# Patient Record
Sex: Female | Born: 1990 | Race: White | Hispanic: No | Marital: Married | Smoking: Never smoker
Health system: Southern US, Community
[De-identification: ages and names within clinical notes are randomized; demographics above are authoritative.]

## PROBLEM LIST (undated history)

## (undated) DIAGNOSIS — H4710 Unspecified papilledema: Secondary | ICD-10-CM

## (undated) HISTORY — DX: Unspecified papilledema: H47.10

---
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
## 2012-12-14 ENCOUNTER — Ambulatory Visit: Payer: Self-pay | Admitting: Certified Nurse Midwife

## 2013-01-04 ENCOUNTER — Telehealth: Payer: Self-pay | Admitting: Certified Nurse Midwife

## 2013-01-04 MED ORDER — NORETHIN ACE-ETH ESTRAD-FE 1-20 MG-MCG PO TABS: 1.0000 | ORAL_TABLET | Freq: Every day | ORAL | Status: DC

## 2013-01-04 NOTE — Telephone Encounter (Signed)
Would like 7month supply of the junel sent to [facility] in [place] at [phone].

## 2013-01-04 NOTE — Telephone Encounter (Signed)
Junel FE 1/20 x 1 month/0 refills [facility] to [facility] in [place], [place] ([address]) per pt's request. aex on 01/17/2013. last aex was on 12/14/2011.

## 2013-01-17 ENCOUNTER — Ambulatory Visit: Payer: Self-pay | Admitting: Certified Nurse Midwife

## 2013-03-15 ENCOUNTER — Telehealth: Payer: Self-pay | Admitting: Certified Nurse Midwife

## 2013-03-15 MED ORDER — NORETHIN ACE-ETH ESTRAD-FE 1-20 MG-MCG PO TABS: 1.0000 | ORAL_TABLET | Freq: Every day | ORAL | Status: DC

## 2013-03-15 NOTE — Telephone Encounter (Signed)
Patient has made appointment for Sept 15,2014 @ 10:45 . She needs her OCP 's called in until this appointment because she's out of her RX. Can you please call another RX. Until this date. Patient  was told that she had to make this appointment .

## 2013-03-15 NOTE — Telephone Encounter (Signed)
Called patient and left vm that we would refill one month ONLY and that it is highly important for her to make appointment 04/10/13 that way she can get refills for a year.  Junel 1/20 #30/0rf's sent to [facility] to last pt until AEX.

## 2013-04-10 ENCOUNTER — Ambulatory Visit: Payer: Self-pay | Admitting: Certified Nurse Midwife

## 2014-07-23 ENCOUNTER — Other Ambulatory Visit: Payer: Self-pay | Admitting: Ophthalmology

## 2014-07-23 DIAGNOSIS — H4710 Unspecified papilledema: Secondary | ICD-10-CM

## 2014-07-30 ENCOUNTER — Other Ambulatory Visit: Payer: Self-pay

## 2014-08-15 ENCOUNTER — Ambulatory Visit (INDEPENDENT_AMBULATORY_CARE_PROVIDER_SITE_OTHER): Payer: Managed Care, Other (non HMO) | Admitting: Neurology

## 2014-08-15 ENCOUNTER — Encounter: Payer: Self-pay | Admitting: Neurology

## 2014-08-15 VITALS — BP 114/74 | HR 80 | Ht 63.0 in | Wt 162.0 lb

## 2014-08-15 DIAGNOSIS — H4710 Unspecified papilledema: Secondary | ICD-10-CM

## 2014-08-15 HISTORY — DX: Unspecified papilledema: H47.10

## 2014-08-15 NOTE — Progress Notes (Signed)
Reason for visit: Papilledema  Kelsey Ferguson is a 24 y.o. female  History of present illness:  Kelsey Ferguson is a 24 year old right-handed white female with a history of a viral illness in October 2015. The patient has severe headache associated with this. The patient has developed some blurring of vision around this time, and by mid December, she went to her optometrist, Dr. Hyacinth Meeker. She was found to have papilledema that was a new finding from the year prior. She was sent to Dr. Luciana Axe, and papilledema was confirmed. The patient was taken off of her birth control pills. She has not had any further headache events, but she continues to have blurred vision. She denies any neck stiffness or muffled hearing. She denies any visual loss or double vision. She has no weakness or numbness of the face, arms, or legs. She denies any issues with balance, or difficulty with falls. She has not had any problems controlling the bowels or the bladder. She denies any memory issues or confusion. She is sent to this office for an evaluation.  Past Medical History  Diagnosis Date  . Optic nerve edema   . 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 No pertinent past surgical history.  Family History  Problem Relation Age of Onset  . Pancreatic cancer Maternal Grandmother   . Heart disease Maternal Grandfather   . Lung cancer Paternal Grandfather   . Healthy Mother   . Healthy Father   . Healthy Sister     Social history:  reports that she has never smoked. She has never used smokeless tobacco. She reports that she drinks alcohol. She reports that she does not use illicit drugs.  Medications:  No current outpatient prescriptions on file prior to visit.   No current facility-administered medications on file prior to visit.     No Known Allergies  ROS:  Out of a complete 14 system review of symptoms, the patient complains only of the following symptoms, and all other reviewed systems are  negative.  Blurred vision Headache  Blood pressure 114/74, pulse 80, height  (1.6 m), weight 162 lb (73.483 kg).  Physical Exam  General: The patient is alert and cooperative at the time of the examination.  Eyes: Pupils are equal, round, and reactive to light. Disc margins are blurred bilaterally. No hemorrhages are seen.  Neck: The neck is supple, no carotid bruits are noted.  Respiratory: The respiratory examination is clear.  Cardiovascular: The cardiovascular examination reveals a regular rate and rhythm, no obvious murmurs or rubs are noted.  Skin: Extremities are without significant edema.  Neurologic Exam  Mental status: The patient is alert and oriented x 3 at the time of the examination. The patient has apparent normal recent and remote memory, with an apparently normal attention span and concentration ability.  Cranial nerves: Facial symmetry is present. There is good sensation of the face to pinprick and soft touch bilaterally. The strength of the facial muscles and the muscles to head turning and shoulder shrug are normal bilaterally. Speech is well enunciated, no aphasia or dysarthria is noted. Extraocular movements are full. Visual fields are full. The tongue is midline, and the patient has symmetric elevation of the soft palate. No obvious hearing deficits are noted.  Motor: The motor testing reveals 5 over 5 strength of all 4 extremities. Good symmetric motor tone is noted throughout.  Sensory: Sensory testing is intact to pinprick, soft touch, vibration sensation, and position sense on all  4 extremities. No evidence of extinction is noted.  Coordination: Cerebellar testing reveals good finger-nose-finger and heel-to-shin bilaterally.  Gait and station: Gait is normal. Tandem gait is normal. Romberg is negative. No drift is seen.  Reflexes: Deep tendon reflexes are symmetric and normal bilaterally. Toes are downgoing bilaterally.   Assessment/Plan:  1.  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 The patient did have onset of a viral illness around the time of the symptoms. The patient be sent for lumbar puncture, and the opening pressure will be documented. If the spinal fluid is normal, and the pressure is elevated, treatment will be initiated. If the opening pressure is normal, we may check visual evoked response test to exclude the possibility of optic nerve involvement. The patient will follow-up in 3 months.  Marlan Palau. Keith Korben Carcione MD 08/15/2014 6:47 PM  [facility] [address] [place], [place] [id number]  Phone [phone] Fax [phone]

## 2014-08-15 NOTE — Patient Instructions (Signed)
Pseudotumor Cerebri Pseudotumor cerebri, also called idiopathic intracranial hypertension, is a condition that occurs due to increased pressure within your skull. Although some of the symptoms resemble those of a brain tumor, it is not a brain tumor. Symptoms occur when the increased pressure in your skull compresses brain structures. For example, pressure on the nerve responsible for vision (optic nerve) causes it to swell, resulting in visual symptoms. Pseudotumor cerebri tends to occur in obese women younger than 24 years of age. However, men and children can also develop this condition. SYMPTOMS  Symptoms of pseudotumor cerebri occur due to increased pressure within the skull. Symptoms may include:   Headaches.  Nausea and vomiting.  Dizziness.  High blood pressure.   Ringing in the ears.  Double or blurred vision.  Brief episodes of complete loss of vision.  Pain in the back, neck, or shoulders. DIAGNOSIS  Pseudotumor cerebri is diagnosed through:  A detailed eye exam, which can reveal a swollen optic nerve, as well as identifying issues such as blind spots in the vision.  An MRI or CT scan to rule out other disorders that can cause similar symptoms, such as brain tumors.  A spinal tap (lumbar puncture), which can demonstrate increased pressure within the skull. TREATMENT  There are several ways that pseudotumor cerebri is treated, including:  Medicines to decrease the production of spinal fluid and lower the pressure within your skull.  Medicines to prevent or treat headaches.  Surgery to create an opening in your optic nerve to allow excess fluid to drain out.  Surgery to place drains (shunts) in your brain to remove excess fluid. HOME CARE INSTRUCTIONS   Take all medicines as directed by your health care provider.  Go to all of your follow-up appointments.  Lose weight if you are overweight. SEEK MEDICAL CARE IF:  Any symptoms come back.  You develop  trouble with hearing, vision, balance, or your sense of smell.  You cannot eat or drink what you need.  You are more weak or tired than usual.   You are losing weight without trying. SEEK IMMEDIATE MEDICAL CARE IF:  You have new symptoms such as vision problems or difficulty walking.   You have a seizure.   You have trouble breathing.   You have a fever.  Document Released: 07/18/2013 Document Reviewed: 07/18/2013 ExitCare Patient Information 2015 [employer]. This information is not intended to replace advice given to you by your health care provider. Make sure you discuss any questions you have with your health care provider.  

## 2014-08-23 ENCOUNTER — Ambulatory Visit
Admission: RE | Admit: 2014-08-23 | Discharge: 2014-08-23 | Disposition: A | Discharge status: Home / Self Care | Payer: Managed Care, Other (non HMO) | Source: Ambulatory Visit | Attending: Neurology | Admitting: Neurology

## 2014-08-23 ENCOUNTER — Other Ambulatory Visit: Payer: Self-pay | Admitting: Neurology

## 2014-08-23 DIAGNOSIS — H4710 Unspecified papilledema: Secondary | ICD-10-CM

## 2014-08-23 LAB — GLUCOSE, CSF: Glucose, CSF: 48 mg/dL (ref 43–76)

## 2014-08-23 LAB — CSF CELL COUNT WITH DIFFERENTIAL
RBC COUNT CSF: 0 uL
Tube #: 3
WBC CSF: 1 uL (ref 0–5)

## 2014-08-23 LAB — PROTEIN, CSF: TOTAL PROTEIN, CSF: 30 mg/dL (ref 15–45)

## 2014-08-23 NOTE — Progress Notes (Signed)
Blood drawn to go with spinal fluid. 1 SST tube filled from right AC, site is unremarkable and pt tolerated procedure well. Discharge instructions explained to pt.

## 2014-08-23 NOTE — Discharge Instructions (Signed)

## 2014-08-25 LAB — VDRL, CSF: VDRL Quant, CSF: NONREACTIVE

## 2014-08-26 ENCOUNTER — Telehealth: Payer: Self-pay | Admitting: Neurology

## 2014-08-26 MED ORDER — ACETAZOLAMIDE 125 MG PO TABS: 125.0000 mg | ORAL_TABLET | Freq: Two times a day (BID) | ORAL | Status: DC

## 2014-08-26 NOTE — Telephone Encounter (Signed)
I called the patient. The CSF looks OK, but the OP was 30. I will call in diamox 125 bid. The patient is having spinal headaches. She is to call me if they persist.

## 2014-08-27 ENCOUNTER — Encounter: Payer: Self-pay | Admitting: Neurology

## 2014-08-27 ENCOUNTER — Telehealth: Payer: Self-pay | Admitting: *Deleted

## 2014-08-27 DIAGNOSIS — G971 Other reaction to spinal and lumbar puncture: Secondary | ICD-10-CM

## 2014-08-27 NOTE — Telephone Encounter (Signed)
I called the patient. She is still having spinal headaches, I'll get her set up for a blood patch. She did not go to work today because of the headache.

## 2014-08-27 NOTE — Telephone Encounter (Signed)
Patient is still having a headache from the LP she had. Patient been resting but still no relief. Please advise

## 2014-08-28 ENCOUNTER — Telehealth: Payer: Self-pay | Admitting: Radiology

## 2014-08-28 ENCOUNTER — Other Ambulatory Visit: Payer: Self-pay | Admitting: Neurology

## 2014-08-28 DIAGNOSIS — H4710 Unspecified papilledema: Secondary | ICD-10-CM

## 2014-08-28 NOTE — Telephone Encounter (Signed)
Had LP last week and was still having headaches. Headache is better today and will wait till tomorrow am to call if blood patch is needed.

## 2014-08-28 NOTE — Telephone Encounter (Signed)
Blood patch order has been entered in other orders.

## 2014-08-30 LAB — OLIGOCLONAL BANDS, CSF + SERM

## 2014-11-08 ENCOUNTER — Ambulatory Visit: Payer: Managed Care, Other (non HMO) | Admitting: Adult Health

## 2014-11-08 ENCOUNTER — Ambulatory Visit (INDEPENDENT_AMBULATORY_CARE_PROVIDER_SITE_OTHER): Payer: Managed Care, Other (non HMO) | Admitting: Nurse Practitioner

## 2014-11-08 ENCOUNTER — Encounter: Payer: Self-pay | Admitting: Nurse Practitioner

## 2014-11-08 VITALS — BP 100/63 | HR 65 | Ht 63.0 in | Wt 147.0 lb

## 2014-11-08 DIAGNOSIS — Z5181 Encounter for therapeutic drug level monitoring: Secondary | ICD-10-CM

## 2014-11-08 DIAGNOSIS — H4710 Unspecified papilledema: Secondary | ICD-10-CM

## 2014-11-08 MED ORDER — ACETAZOLAMIDE 125 MG PO TABS: 125.0000 mg | ORAL_TABLET | Freq: Two times a day (BID) | ORAL | Status: DC

## 2014-11-08 NOTE — Progress Notes (Signed)
I have read the note, and I agree with the clinical assessment and plan.  Verlinda Slotnick KEITH   

## 2014-11-08 NOTE — Patient Instructions (Signed)
Continue Diamox at current dose Will check BMP today Follow-up in 6 months

## 2014-11-08 NOTE — Progress Notes (Signed)
[facility]  PATIENT: Kelsey Ferguson DOB: 05/23/1991   REASON FOR VISIT: Follow-up for papilledema/pseudotumor cerebri syndrome HISTORY FROM: Patient and mother    HISTORY OF PRESENT ILLNESS:Kelsey Ferguson is a 24 year old right-handed white female with a history of a viral illness in October 2015. The patient has severe headache associated with this. The patient has developed some blurring of vision around this time, and by mid December, she went to her optometrist, Dr. Hyacinth MeekerMiller. She was found to have papilledema that was a new finding from the year prior. She was sent to Dr. Luciana Axeankin, and papilledema was confirmed. The patient was taken off of her birth control pills. She has not had any further headache events, but she continues to have blurred vision. She denies any neck stiffness or muffled hearing. She denies any visual loss or double vision. She has no weakness or numbness of the face, arms, or legs. She denies any issues with balance, or difficulty with falls. She has not had any problems controlling the bowels or the bladder. She denies any memory issues or confusion. She is sent to this office for an evaluation. UP DATE 11/08/14 Kelsey Ferguson returns for follow-up of her papilledema. She had LP after last visit with Dr. Anne HahnWillis with opening pressure of 30. She was started on Diamox 125 mg twice daily and she denies side effects to the medication. She denies any blurred vision or double vision. She is not having headaches. She returns for reevaluation  REVIEW OF SYSTEMS: Full 14 system review of systems performed and notable only for those listed, all others are neg:  Constitutional: neg  Cardiovascular: neg Ear/Nose/Throat: neg  Skin: neg Eyes: neg Respiratory: neg Gastroitestinal: neg  Hematology/Lymphatic: neg  Endocrine: neg Musculoskeletal:neg Allergy/Immunology: Allergies  Neurological: neg Psychiatric: neg Sleep : neg   ALLERGIES: No Known Allergies  HOME  MEDICATIONS: Outpatient Prescriptions Prior to Visit  Medication Sig Dispense Refill  . acetaZOLAMIDE (DIAMOX) 125 MG tablet Take 1 tablet (125 mg total) by mouth 2 (two) times daily. 60 tablet 3  . Dapsone 5 % topical gel Apply topically daily.    Marland Kitchen. tretinoin (RETIN-A) 0.025 % cream   0   No facility-administered medications prior to visit.    PAST MEDICAL HISTORY: Past Medical History  Diagnosis Date  . Optic nerve edema   . 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 No pertinent past surgical history.  FAMILY HISTORY: Family History  Problem Relation Age of Onset  . Pancreatic cancer Maternal Grandmother   . Heart disease Maternal Grandfather   . Lung cancer Paternal Grandfather   . Healthy Mother   . Healthy Father   . Healthy Sister     SOCIAL HISTORY: History   Social History  . Marital Status: Married    Spouse Name: N/A  . Number of Children: 0  . Years of Education: BS   Occupational History  . [employer]    Social History Main Topics  . Smoking status: Never Smoker   . Smokeless tobacco: Never Used  . Alcohol Use: 0.0 oz/week    0 Standard drinks or equivalent per week     Comment: socially  . Drug Use: No  . Sexual Activity: Not on file   Other Topics Concern  . Not on file   Social History Narrative   Patient is right handed.   Patient drinks 1 cup caffeine daily   Patient lives in [place] in a house with 3 roommates.  PHYSICAL EXAM  Filed Vitals:   11/08/14 1516  BP: 100/63  Pulse: 65  Height:  (1.6 m)  Weight: 147 lb (66.679 kg)   Body mass index is 26.05 kg/(m^2). General: The patient is alert and cooperative at the time of the examination Neck: The neck is supple, no carotid bruits are noted. Skin: Extremities are without significant edema.  Neurologic Exam  Mental status: The patient is alert and oriented x 3 at the time of the examination. The patient has apparent normal recent and  remote memory, with an apparently normal attention span and concentration ability.  Cranial nerves: Visual acuity 20/20 bilaterally. Pupils are equal, round, and reactive to light. Disc margins are flat. No hemorrhages are seen.Extraocular movements are full. Visual fields are full. Facial symmetry is present. There is good sensation of the face to pinprick and soft touch bilaterally. The strength of the facial muscles and the muscles to head turning and shoulder shrug are normal bilaterally. Speech is well enunciated, no aphasia or dysarthria is noted.  The tongue is midline, and the patient has symmetric elevation of the soft palate. No obvious hearing deficits are noted. Motor: The motor testing reveals 5 over 5 strength of all 4 extremities. Good symmetric motor tone is noted throughout. Sensory: Sensory testing is intact to pinprick, soft touch, vibration sensation, and position sense on all 4 extremities. No evidence of extinction is noted. Coordination: Cerebellar testing reveals good finger-nose-finger and heel-to-shin bilaterally. Gait and station: Gait is normal. Tandem gait is normal. Romberg is negative. No drift is seen. Reflexes: Deep tendon reflexes are symmetric and normal bilaterally. Toes are downgoing bilaterally.   DIAGNOSTIC DATA (LABS, IMAGING, TESTING) - ASSESSMENT AND PLAN  24 y.o. year old female  has a past medical history of Optic nerve edema and Papilledema (08/15/2014). here to follow-up. She had LP with opening pressure of 30.  Continue Diamox at current dose Will check BMP today Follow-up in 6 months Nilda Riggs, [facility], [facility], APRN  [facility] [address] [place], [place] [address] [phone]

## 2014-11-09 ENCOUNTER — Telehealth: Payer: Self-pay

## 2014-11-09 LAB — BASIC METABOLIC PANEL
BUN / CREAT RATIO: 17 (ref 8–20)
BUN: 13 mg/dL (ref 6–20)
CALCIUM: 10 mg/dL (ref 8.7–10.2)
CHLORIDE: 100 mmol/L (ref 97–108)
CO2: 21 mmol/L (ref 18–29)
Creatinine, Ser: 0.78 mg/dL (ref 0.57–1.00)
GFR calc Af Amer: 123 mL/min/{1.73_m2} (ref >59)
GFR calc non Af Amer: 107 mL/min/{1.73_m2} (ref >59)
GLUCOSE: 79 mg/dL (ref 65–99)
Potassium: 3.9 mmol/L (ref 3.5–5.2)
SODIUM: 140 mmol/L (ref 134–144)

## 2014-11-09 NOTE — Telephone Encounter (Signed)
-----   Message from Lynder ParentsNancy C Martin, NP sent at 11/09/2014  8:34 AM EDT ----- Labs normal please call patient

## 2014-11-09 NOTE — Telephone Encounter (Signed)
Called patient to give lab results. No answer. Left detailed message on cell# per DPR.

## 2015-04-16 ENCOUNTER — Telehealth: Payer: Self-pay | Admitting: Neurology

## 2015-04-16 NOTE — Telephone Encounter (Signed)
May consider IUD for birth control. Patient should discuss with her OB physician.

## 2015-04-16 NOTE — Telephone Encounter (Signed)
Patient called is interested in getting on birth control again. Dr. Anne Hahn had previously advised she not take. Patient would like to know options for birth control. What would Dr. Anne Hahn recommend?

## 2015-05-13 ENCOUNTER — Ambulatory Visit: Payer: Managed Care, Other (non HMO) | Admitting: Nurse Practitioner

## 2015-11-27 ENCOUNTER — Telehealth: Payer: Self-pay | Admitting: Neurology

## 2015-11-27 NOTE — Telephone Encounter (Signed)
Pt called in and has moved to [place]. She is requesting a referral. She also needs at least a months worth of acetaZOLAMIDE (DIAMOX) 125 MG tablet until she finds another physician . May call [phone]

## 2015-11-28 MED ORDER — ACETAZOLAMIDE 125 MG PO TABS: 125.0000 mg | ORAL_TABLET | Freq: Two times a day (BID) | ORAL | Status: AC

## 2015-11-28 NOTE — Telephone Encounter (Signed)
I called patient. [place]. I will call in a 90 day prescription for the Diamox, she is to locate a neurologist that we'll treat her pseudotumor, she can let us know who the doctor is, we can make the referral.

## 2016-05-14 IMAGING — XA DG FLUORO GUIDE LUMBAR PUNCTURE
1 series · 1 of 1 positions shown · non-contrast
Comparison: none

CLINICAL DATA: Papilledema.  Intermittent blurry vision.

[Series 1: ortho standard · 1 of 1 slices shown]
[im 1/1]
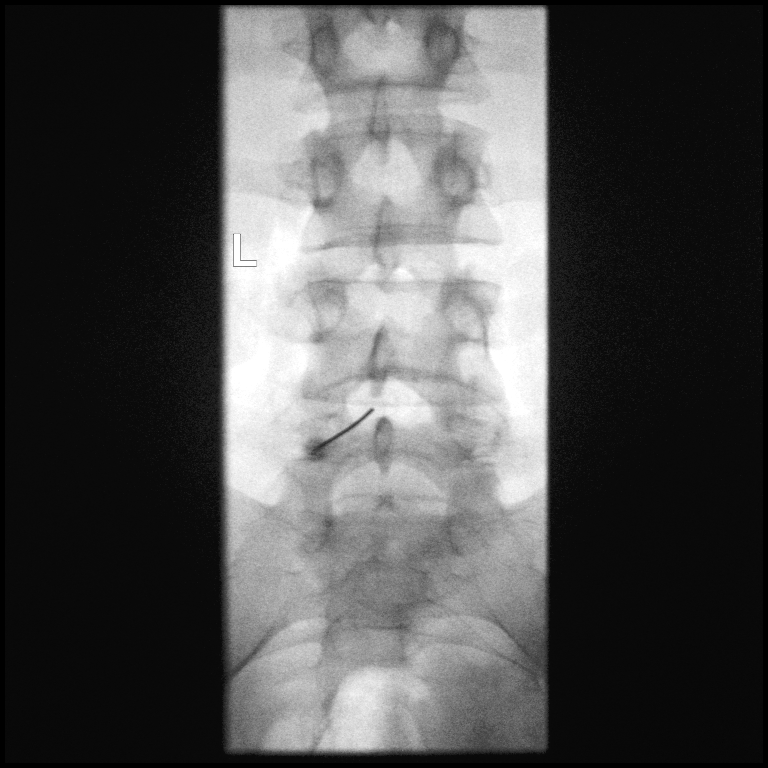

[1 of 1 positions shown; findings below may reference images not displayed]

EXAM:
DIAGNOSTIC LUMBAR PUNCTURE UNDER FLUOROSCOPIC GUIDANCE

FLUOROSCOPY TIME:  0 min 6 seconds

10.75 microGray*m2

PROCEDURE:
Informed consent was obtained from the patient prior to the
procedure, including potential complications of headache, allergy,
and pain. With the patient prone, the lower back was prepped with
Betadine. 1% Lidocaine was used for local anesthesia. Lumbar
puncture was performed at the L4-5 level using a 3.5 inch 20 via a
left paramedian approach gauge needle with return of clear CSF with
an opening pressure of 30 cm water (measured in the left lateral
decubitus position). 8 ml of CSF were obtained for laboratory
studies. The patient tolerated the procedure well and there were no
apparent complications.
IMPRESSION: Successful fluoroscopic guided lumbar puncture. Opening pressure 30
cm water.
